# Patient Record
Sex: Male | Born: 1995 | Race: Black or African American | Hispanic: No | Marital: Single | Smoking: Never smoker
Health system: Southern US, Community
[De-identification: ages and names within clinical notes are randomized; demographics above are authoritative.]

## PROBLEM LIST (undated history)

## (undated) DIAGNOSIS — E559 Vitamin D deficiency, unspecified: Secondary | ICD-10-CM

## (undated) DIAGNOSIS — F32A Depression, unspecified: Secondary | ICD-10-CM

## (undated) DIAGNOSIS — N200 Calculus of kidney: Secondary | ICD-10-CM

---
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
## 2018-01-31 ENCOUNTER — Encounter (HOSPITAL_COMMUNITY): Payer: Self-pay | Admitting: Emergency Medicine

## 2018-01-31 ENCOUNTER — Ambulatory Visit (HOSPITAL_COMMUNITY)
Admission: EM | Admit: 2018-01-31 | Discharge: 2018-01-31 | Disposition: A | Discharge status: Home / Self Care | Payer: BLUE CROSS/BLUE SHIELD | Attending: Family Medicine | Admitting: Family Medicine

## 2018-01-31 DIAGNOSIS — R531 Weakness: Secondary | ICD-10-CM

## 2018-01-31 DIAGNOSIS — R42 Dizziness and giddiness: Secondary | ICD-10-CM

## 2018-01-31 DIAGNOSIS — R110 Nausea: Secondary | ICD-10-CM | POA: Diagnosis not present

## 2018-01-31 DIAGNOSIS — R062 Wheezing: Secondary | ICD-10-CM

## 2018-01-31 DIAGNOSIS — R5383 Other fatigue: Secondary | ICD-10-CM | POA: Diagnosis not present

## 2018-01-31 LAB — POCT I-STAT, CHEM 8
BUN: 10 mg/dL (ref 6–20)
Calcium, Ion: 1.12 mmol/L — ABNORMAL LOW (ref 1.15–1.40)
Chloride: 103 mmol/L (ref 98–111)
Creatinine, Ser: 0.9 mg/dL (ref 0.61–1.24)
Glucose, Bld: 86 mg/dL (ref 70–99)
HCT: 54 % — ABNORMAL HIGH (ref 39.0–52.0)
Hemoglobin: 18.4 g/dL — ABNORMAL HIGH (ref 13.0–17.0)
Potassium: 3.9 mmol/L (ref 3.5–5.1)
Sodium: 141 mmol/L (ref 135–145)
TCO2: 25 mmol/L (ref 22–32)

## 2018-01-31 MED ORDER — AEROCHAMBER PLUS FLO-VU MEDIUM MISC
1.0000 | Freq: Once | Status: AC
  Administered 2018-01-31: 1

## 2018-01-31 MED ORDER — ONDANSETRON 4 MG PO TBDP: 4.0000 mg | ORAL_TABLET | Freq: Three times a day (TID) | ORAL | 0 refills | Status: AC | PRN

## 2018-01-31 MED ORDER — ALBUTEROL SULFATE HFA 108 (90 BASE) MCG/ACT IN AERS
INHALATION_SPRAY | RESPIRATORY_TRACT | Status: AC
  Filled 2018-01-31: qty 6.7

## 2018-01-31 MED ORDER — IPRATROPIUM-ALBUTEROL 0.5-2.5 (3) MG/3ML IN SOLN
3.0000 mL | Freq: Once | RESPIRATORY_TRACT | Status: AC
  Administered 2018-01-31: 3 mL via RESPIRATORY_TRACT

## 2018-01-31 MED ORDER — ALBUTEROL SULFATE HFA 108 (90 BASE) MCG/ACT IN AERS
2.0000 | INHALATION_SPRAY | Freq: Once | RESPIRATORY_TRACT | Status: AC
  Administered 2018-01-31: 2 via RESPIRATORY_TRACT

## 2018-01-31 MED ORDER — AEROCHAMBER PLUS FLO-VU LARGE MISC
Status: AC
  Filled 2018-01-31: qty 1

## 2018-01-31 MED ORDER — IPRATROPIUM-ALBUTEROL 0.5-2.5 (3) MG/3ML IN SOLN
RESPIRATORY_TRACT | Status: AC
  Filled 2018-01-31: qty 3

## 2018-01-31 NOTE — ED Triage Notes (Signed)
Pt here for increased generalized weakness and fatigue with some dizziness

## 2018-01-31 NOTE — ED Provider Notes (Signed)
[facility]    CSN: [id number] Arrival date & time: 01/31/18  1559     History   Chief Complaint Chief Complaint  Patient presents with  . Fatigue    HPI Charles Kemp is a 22 y.o. male.   22 year old male with history of headaches comes in with mother for 2-week history of weakness, lightheadedness, intermittent nausea.  States has not had any aggravating or alleviating factor, and weakness and lightheadedness has been almost constant.  This can happen while he is laying down or when he is standing up.  Denies any increase in activity.  Nausea is intermittent without aggravating or alleviating factor.  Denies abdominal pain, vomiting, diarrhea.  Denies melena, hematochezia.  Has still been eating and drinking.  He denies fever, chills, night sweats.  Denies diaphoresis.  Denies chest pain, palpitations.  Does feel short of breath at times, where he feels as if he needs to work to take a deep breath.  Denies URI symptoms such as cough, congestion, sore throat.  Denies joint pain, though states it feels "tense".  Low back pain that started 3 weeks ago that is worse with movement.  Denies urinary symptoms such as frequency, dysuria, hematuria.  No known tick bites, but cut the grass about 1 to 2 weeks ago.     History reviewed. No pertinent past medical history.  There are no active problems to display for this patient.   History reviewed. No pertinent surgical history.     Home Medications    Prior to Admission medications   Medication Sig Start Date End Date Taking? Authorizing Provider  aspirin-acetaminophen-caffeine (EXCEDRIN MIGRAINE) [phone] MG per tablet Take by mouth every 6 (six) hours as needed for headache.    [provider]  DiphenhydrAMINE HCl (BENADRYL ALLERGY PO) Take by mouth.    [provider]  ipratropium (ATROVENT) 0.06 % nasal spray Place 2 sprays into both nostrils 4 (four) times daily. 04/05/14   Rodolph Bong, MD    ondansetron (ZOFRAN ODT) 4 MG disintegrating tablet Take 1 tablet (4 mg total) by mouth every 8 (eight) hours as needed for nausea or vomiting. 01/31/18   Belinda Fisher, PA-C    Family History History reviewed. No pertinent family history.  Social History Social History   Tobacco Use  . Smoking status: Never Smoker  Substance Use Topics  . Alcohol use: No  . Drug use: No     Allergies   Patient has no known allergies.   Review of Systems Review of Systems  Reason unable to perform ROS: See HPI as above.     Physical Exam Triage Vital Signs ED Triage Vitals [01/31/18 1621]  Enc Vitals Group     BP (!) 156/86     Pulse Rate 72     Resp 18     Temp 98.6 F (37 C)     Temp Source Oral     SpO2 97 %     Weight      Height      Head Circumference      Peak Flow      Pain Score      Pain Loc      Pain Edu?      Excl. in GC?    Orthostatic VS for the past 24 hrs:  BP- Lying Pulse- Lying BP- Sitting Pulse- Sitting BP- Standing at 0 minutes Pulse- Standing at 0 minutes  01/31/18 1711 135/87 73 (!) 143/96 82 (!)  139/96 81    Updated Vital Signs BP (!) 156/86 (BP Location: Left Arm)   Pulse 72   Temp 98.6 F (37 C) (Oral)   Resp 18   SpO2 97%   Physical Exam  Constitutional: He is oriented to person, place, and time. He appears well-developed and well-nourished. No distress.  HENT:  Head: Normocephalic and atraumatic.  Right Ear: Tympanic membrane, external ear and ear canal normal. Tympanic membrane is not erythematous and not bulging.  Left Ear: Tympanic membrane, external ear and ear canal normal. Tympanic membrane is not erythematous and not bulging.  Nose: Nose normal. Right sinus exhibits no maxillary sinus tenderness and no frontal sinus tenderness. Left sinus exhibits no maxillary sinus tenderness and no frontal sinus tenderness.  Mouth/Throat: Uvula is midline, oropharynx is clear and moist and mucous membranes are normal.  Eyes: Pupils are equal, round,  and reactive to light. Conjunctivae and EOM are normal.  Neck: Normal range of motion. Neck supple.  Cardiovascular: Normal rate, regular rhythm and normal heart sounds. Exam reveals no gallop and no friction rub.  No murmur heard. Pulmonary/Chest: Effort normal and breath sounds normal. No accessory muscle usage. No respiratory distress.  Inspiratory and expiratory wheezing of bilateral upper lobes.  No other adventitious lung sounds heard.  Lymphadenopathy:    He has no cervical adenopathy.  Neurological: He is alert and oriented to person, place, and time. He has normal strength. He is not disoriented. No cranial nerve deficit or sensory deficit. He displays a negative Romberg sign. Coordination and gait normal. GCS eye subscore is 4. GCS verbal subscore is 5. GCS motor subscore is 6.  Normal finger-to-nose, rapid movement.  No pronator drift.  Able to get on and off exam table without difficulty.  Skin: Skin is warm and dry.  Psychiatric: He has a normal mood and affect. His behavior is normal. Judgment normal.    UC Treatments / Results  Labs (all labs ordered are listed, but only abnormal results are displayed) Labs Reviewed  POCT I-STAT, CHEM 8 - Abnormal; Notable for the following components:      Result Value   Calcium, Ion 1.12 (*)    Hemoglobin 18.4 (*)    HCT 54.0 (*)    All other components within normal limits    EKG None  Radiology No results found.  Procedures Procedures (including critical care time)  Medications Ordered in UC Medications  ipratropium-albuterol (DUONEB) 0.5-2.5 (3) MG/3ML nebulizer solution 3 mL (3 mLs Nebulization Given 01/31/18 1728)  albuterol (PROVENTIL HFA;VENTOLIN HFA) 108 (90 Base) MCG/ACT inhaler 2 puff (2 puffs Inhalation Provided for home use 01/31/18 1812)  AEROCHAMBER PLUS FLO-VU MEDIUM MISC 1 each (1 each Other Provided for home use 01/31/18 1813)    Initial Impression / Assessment and Plan / UC Course  I have reviewed the triage  vital signs and the nursing notes.  Pertinent labs & imaging results that were available during my care of the patient were reviewed by me and considered in my medical decision making (see chart for details).    Patient with improvement after duoneb, though continues with mild wheezing on expiration of the upper lobe. EKG NSR, 79bpm. istat with hgb of 18.4. Neurology exam grossly intact without focal deficits. Although with concerning history, patient is stable and does not require immediate workup at the emergency department. Will provide albuterol inhaler for wheezing as needed with spacer. Given patient has been avoiding fluid and food intake due to nausea and fear of  vomiting, will provide zofran. Patient to push fluids. Patient with PCP appointment tomorrow, to follow up as scheduled for further management and evaluation. Return precautions given. Patient and mother expresses understanding and agrees to plan.   Final Clinical Impressions(s) / UC Diagnoses   Final diagnoses:  Fatigue, unspecified type  Weakness  Nausea without vomiting    ED Prescriptions    Medication Sig Dispense Auth. Provider   ondansetron (ZOFRAN ODT) 4 MG disintegrating tablet Take 1 tablet (4 mg total) by mouth every 8 (eight) hours as needed for nausea or vomiting. 10 tablet Threasa AlphaYu, Amy V, PA-C        Yu, Amy V, [place] 01/31/18 2150

## 2018-01-31 NOTE — Discharge Instructions (Signed)
No alarming signs on exam.  Your EKG was normal.  Your blood work showed increase in hemoglobin, this could be due to dehydration, but could also be other causes and will need fur there follow up.  Zofran for nausea. Keep hydrated, your urine should be clear to pale yellow in color. As discussed, some wheezing on exam. Will provide albuterol inhaler with chamber, you can use as needed every 4-6 hours for wheezing or shortness of breath. Follow up with PCP as scheduled for further evaluation and management needed. If experiencing worsening symptoms, chest pain, palpitations, shortness of breath not treated by albuterol, passing out, confusion/altered mental status, go to the emergency department for further evaluation needed.

## 2018-02-03 ENCOUNTER — Encounter (HOSPITAL_COMMUNITY): Payer: Self-pay | Admitting: Emergency Medicine

## 2018-02-03 ENCOUNTER — Emergency Department (HOSPITAL_COMMUNITY)
Admission: EM | Admit: 2018-02-03 | Discharge: 2018-02-03 | Disposition: A | Discharge status: Home / Self Care | Payer: BLUE CROSS/BLUE SHIELD | Attending: Emergency Medicine | Admitting: Emergency Medicine

## 2018-02-03 DIAGNOSIS — Z5321 Procedure and treatment not carried out due to patient leaving prior to being seen by health care provider: Secondary | ICD-10-CM | POA: Insufficient documentation

## 2018-02-03 DIAGNOSIS — R51 Headache: Secondary | ICD-10-CM | POA: Diagnosis present

## 2018-02-03 LAB — CBC
HCT: 45.1 % (ref 39.0–52.0)
Hemoglobin: 15.1 g/dL (ref 13.0–17.0)
MCH: 27.7 pg (ref 26.0–34.0)
MCHC: 33.5 g/dL (ref 30.0–36.0)
MCV: 82.6 fL (ref 78.0–100.0)
Platelets: 207 10*3/uL (ref 150–400)
RBC: 5.46 MIL/uL (ref 4.22–5.81)
RDW: 12.8 % (ref 11.5–15.5)
WBC: 4.3 10*3/uL (ref 4.0–10.5)

## 2018-02-03 LAB — COMPREHENSIVE METABOLIC PANEL
ALT: 18 U/L (ref 0–44)
AST: 18 U/L (ref 15–41)
Albumin: 4.3 g/dL (ref 3.5–5.0)
Alkaline Phosphatase: 59 U/L (ref 38–126)
Anion gap: 8 (ref 5–15)
BUN: 8 mg/dL (ref 6–20)
CO2: 27 mmol/L (ref 22–32)
Calcium: 9.6 mg/dL (ref 8.9–10.3)
Chloride: 105 mmol/L (ref 98–111)
Creatinine, Ser: 1.03 mg/dL (ref 0.61–1.24)
GFR calc Af Amer: >60 mL/min (ref >60)
GFR calc non Af Amer: >60 mL/min (ref >60)
Glucose, Bld: 103 mg/dL — ABNORMAL HIGH (ref 70–99)
Potassium: 4.2 mmol/L (ref 3.5–5.1)
Sodium: 140 mmol/L (ref 135–145)
Total Bilirubin: 1.7 mg/dL — ABNORMAL HIGH (ref 0.3–1.2)
Total Protein: 7.7 g/dL (ref 6.5–8.1)

## 2018-02-03 LAB — LIPASE, BLOOD: Lipase: 26 U/L (ref 11–51)

## 2018-02-03 NOTE — ED Notes (Signed)
Pt called for room, no responce 

## 2018-02-03 NOTE — ED Triage Notes (Signed)
Patient here from home with complaints of nausea, vomiting. Seen at Urgent Care on Tuesday given Zofran and seen by PCP Wednesday. Still reports same.

## 2018-02-03 NOTE — ED Notes (Signed)
Called 3x to collect UA sample. No response.

## 2018-02-03 NOTE — ED Notes (Signed)
Patient called and no answer. 

## 2018-02-05 ENCOUNTER — Emergency Department (HOSPITAL_COMMUNITY)
Admission: EM | Admit: 2018-02-05 | Discharge: 2018-02-05 | Disposition: A | Discharge status: Home / Self Care | Payer: BLUE CROSS/BLUE SHIELD | Attending: Emergency Medicine | Admitting: Emergency Medicine

## 2018-02-05 ENCOUNTER — Other Ambulatory Visit: Payer: Self-pay

## 2018-02-05 ENCOUNTER — Encounter (HOSPITAL_COMMUNITY): Payer: Self-pay | Admitting: Emergency Medicine

## 2018-02-05 DIAGNOSIS — Z79899 Other long term (current) drug therapy: Secondary | ICD-10-CM | POA: Insufficient documentation

## 2018-02-05 DIAGNOSIS — R55 Syncope and collapse: Secondary | ICD-10-CM | POA: Insufficient documentation

## 2018-02-05 DIAGNOSIS — E860 Dehydration: Secondary | ICD-10-CM | POA: Diagnosis not present

## 2018-02-05 DIAGNOSIS — R42 Dizziness and giddiness: Secondary | ICD-10-CM | POA: Diagnosis present

## 2018-02-05 DIAGNOSIS — Z7982 Long term (current) use of aspirin: Secondary | ICD-10-CM | POA: Diagnosis not present

## 2018-02-05 LAB — URINALYSIS, ROUTINE W REFLEX MICROSCOPIC
BILIRUBIN URINE: NEGATIVE
Glucose, UA: NEGATIVE mg/dL
Hgb urine dipstick: NEGATIVE
Ketones, ur: 20 mg/dL — AB
Leukocytes, UA: NEGATIVE
Nitrite: NEGATIVE
PH: 8 (ref 5.0–8.0)
Protein, ur: NEGATIVE mg/dL
SPECIFIC GRAVITY, URINE: 1.009 (ref 1.005–1.030)

## 2018-02-05 LAB — COMPREHENSIVE METABOLIC PANEL
ALBUMIN: 4.4 g/dL (ref 3.5–5.0)
ALT: 16 U/L (ref 0–44)
ANION GAP: 8 (ref 5–15)
AST: 18 U/L (ref 15–41)
Alkaline Phosphatase: 62 U/L (ref 38–126)
BILIRUBIN TOTAL: 1.8 mg/dL — AB (ref 0.3–1.2)
BUN: 7 mg/dL (ref 6–20)
CHLORIDE: 103 mmol/L (ref 98–111)
CO2: 31 mmol/L (ref 22–32)
Calcium: 10 mg/dL (ref 8.9–10.3)
Creatinine, Ser: 0.98 mg/dL (ref 0.61–1.24)
GFR calc Af Amer: >60 mL/min (ref >60)
Glucose, Bld: 97 mg/dL (ref 70–99)
POTASSIUM: 3.8 mmol/L (ref 3.5–5.1)
Sodium: 142 mmol/L (ref 135–145)
TOTAL PROTEIN: 8 g/dL (ref 6.5–8.1)

## 2018-02-05 LAB — CBC
HEMATOCRIT: 48.8 % (ref 39.0–52.0)
HEMOGLOBIN: 16.2 g/dL (ref 13.0–17.0)
MCH: 27.4 pg (ref 26.0–34.0)
MCHC: 33.2 g/dL (ref 30.0–36.0)
MCV: 82.4 fL (ref 78.0–100.0)
Platelets: 198 10*3/uL (ref 150–400)
RBC: 5.92 MIL/uL — ABNORMAL HIGH (ref 4.22–5.81)
RDW: 12.6 % (ref 11.5–15.5)
WBC: 4.4 10*3/uL (ref 4.0–10.5)

## 2018-02-05 LAB — LIPASE, BLOOD: LIPASE: 25 U/L (ref 11–51)

## 2018-02-05 LAB — CK: Total CK: 145 U/L (ref 49–397)

## 2018-02-05 LAB — I-STAT CG4 LACTIC ACID, ED: Lactic Acid, Venous: 1.25 mmol/L (ref 0.5–1.9)

## 2018-02-05 MED ORDER — PROMETHAZINE HCL 25 MG/ML IJ SOLN
25.0000 mg | Freq: Once | INTRAMUSCULAR | Status: AC
  Administered 2018-02-05: 25 mg via INTRAVENOUS
  Filled 2018-02-05: qty 1

## 2018-02-05 MED ORDER — PROMETHAZINE HCL 25 MG PO TABS: 25.0000 mg | ORAL_TABLET | Freq: Four times a day (QID) | ORAL | 0 refills | Status: AC | PRN

## 2018-02-05 MED ORDER — SODIUM CHLORIDE 0.9 % IV BOLUS (SEPSIS)
1000.0000 mL | Freq: Once | INTRAVENOUS | Status: AC
  Administered 2018-02-05: 1000 mL via INTRAVENOUS

## 2018-02-05 MED ORDER — SODIUM CHLORIDE 0.9 % IV BOLUS
2000.0000 mL | Freq: Once | INTRAVENOUS | Status: AC
  Administered 2018-02-05: 2000 mL via INTRAVENOUS

## 2018-02-05 NOTE — Discharge Instructions (Addendum)
Follow-up with your primary doctor.  Slowly increase your fluid intake.  Return for any worsening in your condition.

## 2018-02-05 NOTE — ED Notes (Signed)
Pt given an urinal and made aware of need for urine specimen. 

## 2018-02-05 NOTE — ED Provider Notes (Signed)
[facility]-EMERGENCY DEPT Provider Note   CSN: [id number] Arrival date & time: 02/05/18  1332     History   Chief Complaint Chief Complaint  Patient presents with  . Dizziness  . Emesis    HPI Charles Kemp is a 22 y.o. male.  HPI Patient presents to the emergency department with dizziness and vomiting.  The patient states his been having some issues over the last couple weeks.  With dizziness but then started vomiting Tuesday.  Patient states that nothing seems to make the condition better or worse.  Patient states he feels mostly dizzy and first thing in the morning and in a hot shower.  The patient denies chest pain, shortness of breath, headache,blurred vision, neck pain, fever, cough, weakness, numbness, anorexia, edema, abdominal pain, diarrhea, rash, back pain, dysuria, hematemesis, bloody stool, near syncope, or syncope.  Patient was seen by the urgent care at [facility] along with his primary doctor the next day.  The patient states that he was noted to have low vitamin D levels. History reviewed. No pertinent past medical history.  There are no active problems to display for this patient.   History reviewed. No pertinent surgical history.      Home Medications    Prior to Admission medications   Medication Sig Start Date End Date Taking? Authorizing Provider  Albuterol Sulfate (PROAIR RESPICLICK) 108 (90 Base) MCG/ACT AEPB Inhale 2 puffs into the lungs 2 (two) times daily as needed for shortness of breath.   Yes [provider]  aspirin-acetaminophen-caffeine (EXCEDRIN MIGRAINE) [phone] MG per tablet Take by mouth every 6 (six) hours as needed for headache.   Yes [provider]  Cholecalciferol (VITAMIN D3) 50000 units TABS Take 50,000 Units by mouth once a week. 02/03/18 04/22/18 Yes [provider]  DiphenhydrAMINE HCl (BENADRYL ALLERGY PO) Take 1 tablet by mouth daily as needed (seasonal allergies).    Yes [provider]  ipratropium (ATROVENT) 0.06 % nasal spray Place 2 sprays into both nostrils 4 (four) times daily. 04/05/14  Yes Rodolph Bong, MD  ondansetron (ZOFRAN ODT) 4 MG disintegrating tablet Take 1 tablet (4 mg total) by mouth every 8 (eight) hours as needed for nausea or vomiting. 01/31/18  Yes Belinda Fisher, PA-C    Family History No family history on file.  Social History Social History   Tobacco Use  . Smoking status: Never Smoker  . Smokeless tobacco: Never Used  Substance Use Topics  . Alcohol use: No  . Drug use: No     Allergies   Patient has no known allergies.   Review of Systems Review of Systems All other systems negative except as documented in the HPI. All pertinent positives and negatives as reviewed in the HPI.  Physical Exam Updated Vital Signs BP (!) 139/98 (BP Location: Right Arm)   Pulse 63   Temp 98.2 F (36.8 C) (Oral)   Resp 15   SpO2 100%   Physical Exam  Constitutional: He is oriented to person, place, and time. He appears well-developed and well-nourished. No distress.  HENT:  Head: Normocephalic and atraumatic.  Mouth/Throat: Oropharynx is clear and moist.  Eyes: Pupils are equal, round, and reactive to light.  Neck: Normal range of motion. Neck supple.  Cardiovascular: Normal rate, regular rhythm and normal heart sounds. Exam reveals no gallop and no friction rub.  No murmur heard. Pulmonary/Chest: Effort normal and breath sounds normal. No respiratory distress. He has no wheezes.  Abdominal: Soft. Bowel sounds are normal. He exhibits no distension and no mass. There is no tenderness. There is no rebound and no guarding.  Neurological: He is alert and oriented to person, place, and time. No sensory deficit. He exhibits normal muscle tone. Coordination normal.  Skin: Skin is warm and dry. Capillary refill takes less than 2 seconds. No rash noted. No erythema.  Psychiatric: He has a normal mood and affect. His behavior is normal.  Nursing  note and vitals reviewed.    ED Treatments / Results  Labs (all labs ordered are listed, but only abnormal results are displayed) Labs Reviewed  COMPREHENSIVE METABOLIC PANEL - Abnormal; Notable for the following components:      Result Value   Total Bilirubin 1.8 (*)    All other components within normal limits  CBC - Abnormal; Notable for the following components:   RBC 5.92 (*)    All other components within normal limits  URINALYSIS, ROUTINE W REFLEX MICROSCOPIC - Abnormal; Notable for the following components:   Ketones, ur 20 (*)    All other components within normal limits  LIPASE, BLOOD  CK  I-STAT CG4 LACTIC ACID, ED  I-STAT CG4 LACTIC ACID, ED    EKG None  Radiology No results found.  Procedures Procedures (including critical care time)  Medications Ordered in ED Medications  sodium chloride 0.9 % bolus 2,000 mL (0 mLs Intravenous Stopped 02/05/18 1845)  promethazine (PHENERGAN) injection 25 mg (25 mg Intravenous Given 02/05/18 1621)  sodium chloride 0.9 % bolus 1,000 mL (1,000 mLs Intravenous New Bag/Given 02/05/18 1851)     Initial Impression / Assessment and Plan / ED Course  I have reviewed the triage vital signs and the nursing notes.  Pertinent labs & imaging results that were available during my care of the patient were reviewed by me and considered in my medical decision making (see chart for details).     Based off the recent laboratory testing along with this testing here today the patient does appear to have some level of dehydration.  He was given 3 L of IV fluids along with anti-medic.  The patient is reporting no problems at this time.  His vital signs have been stable here in the emergency department.  His blood pressures are slightly elevated I did advise the family to follow-up with his doctor on these.  Patient is advised to return here as needed.  Told to increase his fluid intake and rest as much as possible.  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  Final Clinical Impressions(s) / ED Diagnoses   Final diagnoses:  None    ED Discharge Orders    None       Charlestine NightLawyer, Zniyah Midkiff, PA-C 02/05/18 1931    Long, Arlyss RepressJoshua G, MD 02/06/18 1130

## 2018-02-05 NOTE — ED Notes (Signed)
He remains in no distress, and his mother remains with him.

## 2018-02-05 NOTE — ED Triage Notes (Signed)
Pt c/o dizziness since 3 weeks. Reports vomiting started Tuesday. Went to UC and was told all labs looked good. Reports followed up with PCP the next day and was told Vit D low-prescribed that. Pt also seen here but left before was seen due to long waits.  Yesterday was first day taking Vit D 3. Zofran not working.

## 2018-02-08 ENCOUNTER — Ambulatory Visit: Payer: BLUE CROSS/BLUE SHIELD | Admitting: Gastroenterology

## 2021-10-22 ENCOUNTER — Ambulatory Visit
Admission: EM | Admit: 2021-10-22 | Discharge: 2021-10-22 | Disposition: A | Discharge status: Home / Self Care | Payer: BC Managed Care – PPO

## 2021-10-22 ENCOUNTER — Other Ambulatory Visit: Payer: Self-pay

## 2021-10-22 ENCOUNTER — Encounter: Payer: Self-pay | Admitting: Emergency Medicine

## 2021-10-22 DIAGNOSIS — R599 Enlarged lymph nodes, unspecified: Secondary | ICD-10-CM

## 2021-10-22 DIAGNOSIS — R030 Elevated blood-pressure reading, without diagnosis of hypertension: Secondary | ICD-10-CM

## 2021-10-22 NOTE — Discharge Instructions (Addendum)
Some people have lymph nodes that you can feel through their skin. Yours seem normal and healthy to me today.  ? ?Keep an eye on your blood pressure - it's a little high today.  Blood pressure can be high because of pain, stress, caffeine, cigarette smoking but can also be high due to hypertension a disease of the blood pressure where it is elevated all the time.  ?

## 2021-10-22 NOTE — ED Provider Notes (Signed)
?[facility] ? ? ? ?CSN: [id number] ?Arrival date & time: 10/22/21  1102 ? ? ?  ? ?History   ?Chief Complaint ?Chief Complaint  ?Patient presents with  ? Bump Groin Area  ? ? ?HPI ?Charles Kemp is a 26 y.o. male.  Patient noticed a bump in his right groin area about a week ago.  Is not painful unless he pushes on it.  Denies urinary symptoms.  Denies rash, fever, chills, penile discharge, any genital lesions. ? ?HPI ? ?History reviewed. No pertinent past medical history. ? ?There are no problems to display for this patient. ? ? ?History reviewed. No pertinent surgical history. ? ? ? ? ?Home Medications   ? ?Prior to Admission medications   ?Medication Sig Start Date End Date Taking? Authorizing Provider  ?Albuterol Sulfate (PROAIR RESPICLICK) 108 (90 Base) MCG/ACT AEPB Inhale 2 puffs into the lungs 2 (two) times daily as needed for shortness of breath.    [provider]  ?aspirin-acetaminophen-caffeine (EXCEDRIN MIGRAINE) [phone] MG per tablet Take by mouth every 6 (six) hours as needed for headache.    [provider]  ?DiphenhydrAMINE HCl (BENADRYL ALLERGY PO) Take 1 tablet by mouth daily as needed (seasonal allergies).     [provider]  ?ipratropium (ATROVENT) 0.06 % nasal spray Place 2 sprays into both nostrils 4 (four) times daily. 04/05/14   Rodolph Bong, MD  ?ondansetron (ZOFRAN ODT) 4 MG disintegrating tablet Take 1 tablet (4 mg total) by mouth every 8 (eight) hours as needed for nausea or vomiting. 01/31/18   Cathie Hoops, Amy V, PA-C  ?promethazine (PHENERGAN) 25 MG tablet Take 1 tablet (25 mg total) by mouth every 6 (six) hours as needed for nausea or vomiting. 02/05/18   Charlestine Night, PA-C  ? ? ?Family History ?History reviewed. No pertinent family history. ? ?Social History ?Social History  ? ?Tobacco Use  ? Smoking status: Never  ? Smokeless tobacco: Never  ?Vaping Use  ? Vaping Use: Every day  ?Substance Use Topics  ? Alcohol use: No  ? Drug use: No   ? ? ? ?Allergies   ?Patient has no known allergies. ? ? ?Review of Systems ?Review of Systems  ?Constitutional:  Negative for chills and fever.  ?Genitourinary:  Negative for dysuria, genital sores and penile discharge.  ?Skin:  Negative for rash and wound.  ? ? ?Physical Exam ?Triage Vital Signs ?ED Triage Vitals  ?Enc Vitals Group  ?   BP 10/22/21 1115 (!) 144/100  ?   Pulse Rate 10/22/21 1115 87  ?   Resp 10/22/21 1115 18  ?   Temp 10/22/21 1115 98.2 ?F (36.8 ?C)  ?   Temp Source 10/22/21 1115 Oral  ?   SpO2 10/22/21 1115 98 %  ?   Weight --   ?   Height --   ?   Head Circumference --   ?   Peak Flow --   ?   Pain Score 10/22/21 1120 0  ?   Pain Loc --   ?   Pain Edu? --   ?   Excl. in GC? --   ? ?No data found. ? ?Updated Vital Signs ?BP (!) 145/94 (BP Location: Left Arm)   Pulse 87   Temp 98.2 ?F (36.8 ?C) (Oral)   Resp 18   SpO2 98%  ? ?Visual Acuity ?Right Eye Distance:   ?Left Eye Distance:   ?Bilateral Distance:   ? ?Right Eye Near:   ?Left  Eye Near:    ?Bilateral Near:    ? ?Physical Exam ?Constitutional:   ?   Appearance: Normal appearance. He is not ill-appearing.  ?Pulmonary:  ?   Effort: Pulmonary effort is normal.  ?Genitourinary: ?   Pubic Area: No rash.   ?Lymphadenopathy:  ?   Comments: Palpable lymph nodes x 2 in R groin. No lymphadenopathy  ?Skin: ?   General: Skin is warm and dry.  ?Neurological:  ?   Mental Status: He is alert.  ? ? ? ?UC Treatments / Results  ?Labs ?(all labs ordered are listed, but only abnormal results are displayed) ?Labs Reviewed - No data to display ? ?EKG ? ? ?Radiology ?No results found. ? ?Procedures ?Procedures (including critical care time) ? ?Medications Ordered in UC ?Medications - No data to display ? ?Initial Impression / Assessment and Plan / UC Course  ?I have reviewed the triage vital signs and the nursing notes. ? ?Pertinent labs & imaging results that were available during my care of the patient were reviewed by me and considered in my medical decision  making (see chart for details). ? ?  ?Reassured patient palpable lymph nodes and thin, healthy people are not unusual.  BP recheck shows BP still elevated today; instructed patient to pay attention to BP and have it checked again in the future. ? ?Final Clinical Impressions(s) / UC Diagnoses  ? ?Final diagnoses:  ?Palpable lymph node  ?Elevated BP without diagnosis of hypertension  ? ? ? ?Discharge Instructions   ? ?  ?Some people have lymph nodes that you can feel through their skin. Yours seem normal and healthy to me today.  ? ?Keep an eye on your blood pressure - it's a little high today.  Blood pressure can be high because of pain, stress, caffeine, cigarette smoking but can also be high due to hypertension a disease of the blood pressure where it is elevated all the time.  ? ? ?ED Prescriptions   ?None ?  ? ?PDMP not reviewed this encounter. ?  ?Cathlyn Parsons, NP ?10/22/21 1151 ? ?

## 2021-10-22 NOTE — ED Triage Notes (Signed)
Pt presents with a bump on his groin area x 1 week. Pt states pain only occurs when pressure is applied to the area. Pt denies any urinary symptoms.  ?

## 2021-10-29 ENCOUNTER — Other Ambulatory Visit: Payer: Self-pay

## 2021-10-29 ENCOUNTER — Encounter: Payer: Self-pay | Admitting: Emergency Medicine

## 2021-10-29 ENCOUNTER — Ambulatory Visit
Admission: EM | Admit: 2021-10-29 | Discharge: 2021-10-29 | Disposition: A | Discharge status: Other Acute Inpatient Hospital | Payer: BC Managed Care – PPO | Attending: Internal Medicine | Admitting: Internal Medicine

## 2021-10-29 DIAGNOSIS — K921 Melena: Secondary | ICD-10-CM | POA: Insufficient documentation

## 2021-10-29 DIAGNOSIS — R369 Urethral discharge, unspecified: Secondary | ICD-10-CM

## 2021-10-29 DIAGNOSIS — Z113 Encounter for screening for infections with a predominantly sexual mode of transmission: Secondary | ICD-10-CM | POA: Diagnosis present

## 2021-10-29 LAB — POCT URINALYSIS DIP (MANUAL ENTRY)
Bilirubin, UA: NEGATIVE
Glucose, UA: NEGATIVE mg/dL
Nitrite, UA: NEGATIVE
Protein Ur, POC: NEGATIVE mg/dL
Spec Grav, UA: 1.02 (ref 1.010–1.025)
Urobilinogen, UA: 0.2 E.U./dL
pH, UA: 6.5 (ref 5.0–8.0)

## 2021-10-29 MED ORDER — CEFTRIAXONE SODIUM 1 G IJ SOLR
1.0000 g | Freq: Once | INTRAMUSCULAR | Status: AC
  Administered 2021-10-29: 1 g via INTRAMUSCULAR

## 2021-10-29 NOTE — ED Triage Notes (Signed)
Patient c/o blood in urine this morning, denies any pain.  Patient did have penile discharge about a week ago.  Would like STIs.  No dysuria, some frequency, no urgency. ?

## 2021-10-29 NOTE — Discharge Instructions (Signed)
You have been treated today for possible STD as well as possible urinary tract infection.  Your STD swab is pending.  Your urine culture is also pending.  We will call if there are any abnormalities.  Please refrain from sexual activity until test results and treatment are complete.  Please go to the ER as soon as you leave urgent care for further evaluation and management of blood in your stool. ?

## 2021-10-29 NOTE — ED Provider Notes (Addendum)
?[facility] ? ? ? ?CSN: [id number] ?Arrival date & time: 10/29/21  1348 ? ? ?  ? ?History   ?Chief Complaint ?Chief Complaint  ?Patient presents with  ? Hematuria  ? ? ?HPI ?Charles Kemp is a 26 y.o. male.  ? ?Patient originally reported to nurse in triage that he was having some blood in his urine as well as penile discharge.  He reported to healthcare provider that he was not having blood in his urine, but he was having blood in his stool.  Patient reports that he had one episode of copious amount of blood in the toilet that came from his rectum earlier today.  Denies any associated rectal pain, diarrhea, constipation, nausea, vomiting.  Patient reports has been having normal bowel movements.  Denies any suspicion of hemorrhoids.  Patient does report that he has been having some penile discharge that is yellow in color but denies any dysuria, urinary frequency, urinary burning, testicular pain.  He was seen at a different urgent care on 10/22/2021 for right groin lymph node swelling but no STD testing was completed at that time.  He was sent home with watchful waiting.  Denies any known exposure to STD but has had unprotected sexual intercourse recently. ? ? ?Hematuria ? ? ?History reviewed. No pertinent past medical history. ? ?There are no problems to display for this patient. ? ? ?History reviewed. No pertinent surgical history. ? ? ? ? ?Home Medications   ? ?Prior to Admission medications   ?Medication Sig Start Date End Date Taking? Authorizing Provider  ?Albuterol Sulfate (PROAIR RESPICLICK) 108 (90 Base) MCG/ACT AEPB Inhale 2 puffs into the lungs 2 (two) times daily as needed for shortness of breath.    [provider]  ?aspirin-acetaminophen-caffeine (EXCEDRIN MIGRAINE) [phone] MG per tablet Take by mouth every 6 (six) hours as needed for headache.    [provider]  ?DiphenhydrAMINE HCl (BENADRYL ALLERGY PO) Take 1 tablet by mouth daily as needed (seasonal allergies).      [provider]  ?ipratropium (ATROVENT) 0.06 % nasal spray Place 2 sprays into both nostrils 4 (four) times daily. 04/05/14   Rodolph Bong, MD  ?ondansetron (ZOFRAN ODT) 4 MG disintegrating tablet Take 1 tablet (4 mg total) by mouth every 8 (eight) hours as needed for nausea or vomiting. 01/31/18   Cathie Hoops, Amy V, PA-C  ?promethazine (PHENERGAN) 25 MG tablet Take 1 tablet (25 mg total) by mouth every 6 (six) hours as needed for nausea or vomiting. 02/05/18   Charlestine Night, PA-C  ? ? ?Family History ?History reviewed. No pertinent family history. ? ?Social History ?Social History  ? ?Tobacco Use  ? Smoking status: Never  ? Smokeless tobacco: Never  ?Vaping Use  ? Vaping Use: Every day  ?Substance Use Topics  ? Alcohol use: No  ? Drug use: No  ? ? ? ?Allergies   ?Patient has no known allergies. ? ? ?Review of Systems ?Review of Systems ?Per HPI ? ?Physical Exam ?Triage Vital Signs ?ED Triage Vitals  ?Enc Vitals Group  ?   BP 10/29/21 1404 (!) 147/94  ?   Pulse Rate 10/29/21 1404 78  ?   Resp 10/29/21 1404 18  ?   Temp 10/29/21 1404 98.2 ?F (36.8 ?C)  ?   Temp Source 10/29/21 1404 Oral  ?   SpO2 10/29/21 1404 98 %  ?   Weight 10/29/21 1405 148 lb (67.1 kg)  ?   Height 10/29/21 1405 6\' 1"  (  1.854 m)  ?   Head Circumference --   ?   Peak Flow --   ?   Pain Score 10/29/21 1405 0  ?   Pain Loc --   ?   Pain Edu? --   ?   Excl. in GC? --   ? ?No data found. ? ?Updated Vital Signs ?BP (!) 147/94 (BP Location: Left Arm)   Pulse 78   Temp 98.2 ?F (36.8 ?C) (Oral)   Resp 18   Ht 6\' 1"  (1.854 m)   Wt 148 lb (67.1 kg)   SpO2 98%   BMI 19.53 kg/m?  ? ?Visual Acuity ?Right Eye Distance:   ?Left Eye Distance:   ?Bilateral Distance:   ? ?Right Eye Near:   ?Left Eye Near:    ?Bilateral Near:    ? ?Physical Exam ?Constitutional:   ?   General: He is not in acute distress. ?   Appearance: Normal appearance. He is not toxic-appearing or diaphoretic.  ?HENT:  ?   Head: Normocephalic and atraumatic.  ?Eyes:  ?   Extraocular  Movements: Extraocular movements intact.  ?   Conjunctiva/sclera: Conjunctivae normal.  ?Cardiovascular:  ?   Rate and Rhythm: Normal rate and regular rhythm.  ?   Pulses: Normal pulses.  ?   Heart sounds: Normal heart sounds.  ?Pulmonary:  ?   Effort: Pulmonary effort is normal. No respiratory distress.  ?   Breath sounds: Normal breath sounds.  ?Genitourinary: ?   Comments: Deferred with shared decision making.  Self swab performed. ?Neurological:  ?   General: No focal deficit present.  ?   Mental Status: He is alert and oriented to person, place, and time. Mental status is at baseline.  ?Psychiatric:     ?   Mood and Affect: Mood normal.     ?   Behavior: Behavior normal.     ?   Thought Content: Thought content normal.     ?   Judgment: Judgment normal.  ? ? ? ?UC Treatments / Results  ?Labs ?(all labs ordered are listed, but only abnormal results are displayed) ?Labs Reviewed  ?POCT URINALYSIS DIP (MANUAL ENTRY) - Abnormal; Notable for the following components:  ?    Result Value  ? Ketones, POC UA large (80) (*)   ? Blood, UA trace-intact (*)   ? Leukocytes, UA Small (1+) (*)   ? All other components within normal limits  ?URINE CULTURE  ?CYTOLOGY, (ORAL, ANAL, URETHRAL) ANCILLARY ONLY  ? ? ?EKG ? ? ?Radiology ?No results found. ? ?Procedures ?Procedures (including critical care time) ? ?Medications Ordered in UC ?Medications  ?cefTRIAXone (ROCEPHIN) injection 1 g (has no administration in time range)  ? ? ?Initial Impression / Assessment and Plan / UC Course  ?I have reviewed the triage vital signs and the nursing notes. ? ?Pertinent labs & imaging results that were available during my care of the patient were reviewed by me and considered in my medical decision making (see chart for details). ? ?  ? ?Due to patient reporting that he had hematuria to nurse during triage, urinalysis and cytology swab were completed prior to being seen by healthcare provider.  It did show ketones, blood, and small amount of  leukocytes.  Cytology swab was completed due to patient reporting penile discharge and patient request for STD penile swab.  Patient had picture of amount of blood that was in toilet from earlier today, and it is a copious amount of bright  red blood.  Therefore, patient was advised that he will need to go to the hospital for further evaluation and management of this as he requires a more extensive evaluation than can be provided at the urgent care.  Patient was agreeable with plan.  I do think it is reasonable to administer 1000 mg Rocephin IM here in urgent care to cover for possible UTI with small leuks on urinalysis as well as possible gonorrhea with penile discharge.  Urine culture and cytology swab are pending.  Will wait to add or change treatment if necessary once these results are back.  Patient was agreeable with plan and left to go to the hospital to have blood in stool evaluated once injection was given.  I do think that the large amount of ketones in the urine needs to be evaluated at the ED as well. Vital signs are stable so patient left via self transport. ?Final Clinical Impressions(s) / UC Diagnoses  ? ?Final diagnoses:  ?Penile discharge  ?Screening examination for venereal disease  ?Blood in stool  ? ? ? ?Discharge Instructions   ? ?  ?You have been treated today for possible STD as well as possible urinary tract infection.  Your STD swab is pending.  Your urine culture is also pending.  We will call if there are any abnormalities.  Please refrain from sexual activity until test results and treatment are complete.  Please go to the ER as soon as you leave urgent care for further evaluation and management of blood in your stool. ? ? ? ?ED Prescriptions   ?None ?  ? ?PDMP not reviewed this encounter. ?  ?Gustavus Bryant, [place] ?10/29/21 1513 ? ?  ?Gustavus Bryant, [place] ?10/29/21 1514 ? ?

## 2021-10-30 ENCOUNTER — Telehealth (HOSPITAL_COMMUNITY): Payer: Self-pay | Admitting: Emergency Medicine

## 2021-10-30 LAB — URINE CULTURE: Culture: NO GROWTH

## 2021-10-30 LAB — CYTOLOGY, (ORAL, ANAL, URETHRAL) ANCILLARY ONLY
Chlamydia: POSITIVE — AB
Comment: NEGATIVE
Comment: NEGATIVE
Comment: NORMAL
Neisseria Gonorrhea: NEGATIVE
Trichomonas: NEGATIVE

## 2021-10-30 MED ORDER — DOXYCYCLINE HYCLATE 100 MG PO CAPS: 100.0000 mg | ORAL_CAPSULE | Freq: Two times a day (BID) | ORAL | 0 refills | Status: AC

## 2022-08-01 ENCOUNTER — Telehealth: Payer: BC Managed Care – PPO | Admitting: Family

## 2022-08-01 DIAGNOSIS — R109 Unspecified abdominal pain: Secondary | ICD-10-CM

## 2022-08-01 DIAGNOSIS — R319 Hematuria, unspecified: Secondary | ICD-10-CM | POA: Diagnosis not present

## 2022-08-01 MED ORDER — TAMSULOSIN HCL 0.4 MG PO CAPS: 0.4000 mg | ORAL_CAPSULE | Freq: Every day | ORAL | 0 refills | Status: AC

## 2022-08-01 NOTE — Progress Notes (Signed)
Virtual Visit Consent   Charles Kemp, you are scheduled for a virtual visit with a [facility] provider today. Just as with appointments in the office, your consent must be obtained to participate. Your consent will be active for this visit and any virtual visit you may have with one of our providers in the next 365 days. If you have a MyChart account, a copy of this consent can be sent to you electronically.  As this is a virtual visit, video technology does not allow for your provider to perform a traditional examination. This may limit your provider's ability to fully assess your condition. If your provider identifies any concerns that need to be evaluated in person or the need to arrange testing (such as labs, EKG, etc.), we will make arrangements to do so. Although advances in technology are sophisticated, we cannot ensure that it will always work on either your end or our end. If the connection with a video visit is poor, the visit may have to be switched to a telephone visit. With either a video or telephone visit, we are not always able to ensure that we have a secure connection.  By engaging in this virtual visit, you consent to the provision of healthcare and authorize for your insurance to be billed (if applicable) for the services provided during this visit. Depending on your insurance coverage, you may receive a charge related to this service.  I need to obtain your verbal consent now. Are you willing to proceed with your visit today? Charles Kemp has provided verbal consent on 08/01/2022 for a virtual visit (video or telephone). Evelina Dun, FNP  Date: 08/01/2022 5:16 PM  Virtual Visit via Video Note   I, Evelina Dun, connected with  Charles Kemp  ([id number], 1996/01/03) on 08/01/22 at  5:15 PM EST by a video-enabled telemedicine application and verified that I am speaking with the correct person using two identifiers.  Location: Patient: Virtual Visit Location Patient:  Home Provider: Virtual Visit Location Provider: Home Office   I discussed the limitations of evaluation and management by telemedicine and the availability of in person appointments. The patient expressed understanding and agreed to proceed.    History of Present Illness: Charles Kemp is a 26 y.o. who identifies as a male who was assigned male at birth, and is being seen today for flank pain that started two weeks ago. He was seen at the Urgent Care and was given antibiotics for UTI.   HPI: Flank Pain This is a new problem. The current episode started 1 to 4 weeks ago. The problem occurs intermittently. The problem has been waxing and waning since onset. The pain is at a severity of 10/10. The pain is moderate. Pertinent negatives include no bladder incontinence, dysuria or fever. Heath Lark )    Problems: There are no problems to display for this patient.   Allergies: No Known Allergies Medications:  Current Outpatient Medications:    tamsulosin (FLOMAX) 0.4 MG CAPS capsule, Take 1 capsule (0.4 mg total) by mouth daily., Disp: 30 capsule, Rfl: 0   Albuterol Sulfate (PROAIR RESPICLICK) 123XX123 (90 Base) MCG/ACT AEPB, Inhale 2 puffs into the lungs 2 (two) times daily as needed for shortness of breath., Disp: , Rfl:    aspirin-acetaminophen-caffeine (EXCEDRIN MIGRAINE) 250-250-65 MG per tablet, Take by mouth every 6 (six) hours as needed for headache., Disp: , Rfl:    DiphenhydrAMINE HCl (BENADRYL ALLERGY PO), Take 1 tablet by mouth daily as needed (seasonal allergies). ,  Disp: , Rfl:    ipratropium (ATROVENT) 0.06 % nasal spray, Place 2 sprays into both nostrils 4 (four) times daily., Disp: 15 mL, Rfl: 1   ondansetron (ZOFRAN ODT) 4 MG disintegrating tablet, Take 1 tablet (4 mg total) by mouth every 8 (eight) hours as needed for nausea or vomiting., Disp: 10 tablet, Rfl: 0   promethazine (PHENERGAN) 25 MG tablet, Take 1 tablet (25 mg total) by mouth every 6 (six) hours as needed for  nausea or vomiting., Disp: 10 tablet, Rfl: 0  Observations/Objective: Patient is well-developed, well-nourished in no acute distress.  Resting comfortably  at home.  Head is normocephalic, atraumatic.  No labored breathing.  Speech is clear and coherent with logical content.  Patient is alert and oriented at baseline.    Assessment and Plan: 1. Flank pain - tamsulosin (FLOMAX) 0.4 MG CAPS capsule; Take 1 capsule (0.4 mg total) by mouth daily.  Dispense: 30 capsule; Refill: 0  2. Hematuria, unspecified type - tamsulosin (FLOMAX) 0.4 MG CAPS capsule; Take 1 capsule (0.4 mg total) by mouth daily.  Dispense: 30 capsule; Refill: 0  Force fluids Call and make follow up with PCP this week. IF does not not pass stone will need CT scan. He is able to urinate at this time, no fever or current pain   Follow Up Instructions: I discussed the assessment and treatment plan with the patient. The patient was provided an opportunity to ask questions and all were answered. The patient agreed with the plan and demonstrated an understanding of the instructions.  A copy of instructions were sent to the patient via MyChart unless otherwise noted below.    The patient was advised to call back or seek an in-person evaluation if the symptoms worsen or if the condition fails to improve as anticipated.  Time:  I spent 12 minutes with the patient via telehealth technology discussing the above problems/concerns.    Jannifer Rodney, FNP

## 2022-08-01 NOTE — Patient Instructions (Signed)
Kidney Stones  Kidney stones are solid, rock-like deposits that form inside of the kidneys. The kidneys are a pair of organs that make urine. A kidney stone may form in a kidney and move into other parts of the urinary tract, including the tubes that connect the kidneys to the bladder (ureters), the bladder, and the tube that carries urine out of the body (urethra). As the stone moves through these areas, it can cause intense pain and block the flow of urine. Kidney stones are created when high levels of certain minerals are found in the urine. The stones are usually passed out of the body through urination, but in some cases, medical treatment may be needed to remove them. What are the causes? Kidney stones may be caused by: A condition in which certain glands produce too much parathyroid hormone (primary hyperparathyroidism), which causes too much calcium buildup in the blood. A buildup of uric acid crystals in the bladder (hyperuricosuria). Uric acid is a chemical that the body produces when you eat certain foods. It usually leaves the body in the urine. Narrowing (stricture) of one or both of the ureters. A kidney blockage that is present at birth (congenital obstruction). Past surgery on the kidney or the ureters. What increases the risk? The following factors may make you more likely to develop this condition: Having had a kidney stone in the past. Having a family history of kidney stones. Not drinking enough water. Eating a diet that is high in protein, salt (sodium), or sugar. Being overweight or obese. What are the signs or symptoms? Symptoms of a kidney stone may include: Pain in the side of the abdomen, right below the ribs (flank pain). Pain usually spreads (radiates) to the groin. Needing to urinate often or urgently. Painful urination. Blood in the urine (hematuria). Nausea. Vomiting. Fever and chills. How is this diagnosed? This condition may be diagnosed based on: Your  symptoms and medical history. A physical exam. Blood tests. Urine tests. These may be done before and after the stone passes out of your body through urination. Imaging tests, such as a CT scan, abdominal X-ray, or ultrasound. A procedure to examine the inside of the bladder (cystoscopy). How is this treated? Treatment for kidney stones depends on the size, location, and makeup of the stones. Kidney stones will often pass out of the body through urination. You may need to: Increase your fluid intake to help pass the stone. In some cases, you may be given fluids through an IV and may need to be monitored in the hospital. Take medicine for pain. Make changes in your diet to help prevent kidney stones from coming back. Sometimes, procedures are needed to remove a kidney stone. This may involve: A procedure to break up kidney stones using: A focused beam of light (laser therapy). Shock waves (extracorporeal shock wave lithotripsy). Surgery to remove kidney stones. This may be needed if you have severe pain or have stones that block your urinary tract. Follow these instructions at home: Medicines Take over-the-counter and prescription medicines only as told by your health care provider. Ask your health care provider if the medicine prescribed to you requires you to avoid driving or using heavy machinery. Eating and drinking Drink enough fluid to keep your urine pale yellow. You may be instructed to drink at least 8-10 glasses of water each day. This will help you pass the kidney stone. If directed, change your diet. This may include: Limiting how much sodium you eat. Eating more fruits   and vegetables. Limiting how much animal protein you eat. Animal proteins include red meat, poultry, fish, and eggs. Eating a normal amount of calcium (1,000-1,300 mg per day). Follow instructions from your health care provider about eating or drinking restrictions. General instructions Collect urine samples as  told by your health care provider. You may need to collect a urine sample: 24 hours after you pass the stone. 8-12 weeks after you pass the kidney stone, and every 6-12 months after that. Strain your urine every time you urinate, for as long as directed. Use the strainer that your health care provider recommends. Do not throw out the kidney stone after passing it. Keep the stone so it can be tested by your health care provider. Testing the makeup of your kidney stone may help prevent you from getting kidney stones in the future. Keep all follow-up visits. You may need follow-up X-rays or ultrasounds to make sure that your stone has passed. How is this prevented? To prevent another kidney stone: Drink enough fluid to keep your urine pale yellow. This is the best way to prevent kidney stones. Eat a healthy diet. Follow recommendations from your health care provider about foods to avoid. Recommendations vary depending on the type of kidney stone that you have. You may be instructed to eat a low-protein diet. Maintain a healthy weight. Where to find more information National Kidney Foundation (NKF): www.kidney.org [facility] (UCF): www.urologyhealth.org Contact a health care provider if: You have pain that gets worse or does not get better with medicine. Get help right away if: You have a fever or chills. You develop severe pain. You develop new abdominal pain. You faint. You are unable to urinate. Summary Kidney stones are solid, rock-like deposits that form inside of the kidneys. Kidney stones can cause nausea, vomiting, blood in the urine, abdominal pain, and the urge to urinate often. Treatment for kidney stones depends on the size, location, and makeup of the stones. Kidney stones will often pass out of the body through urination. Kidney stones can be prevented by drinking enough fluids, eating a healthy diet, and maintaining a healthy weight. This information is not intended  to replace advice given to you by your health care provider. Make sure you discuss any questions you have with your health care provider. Document Revised: 10/28/2021 Document Reviewed: 10/28/2021 Elsevier Patient Education  2023 [employer] Inc.  

## 2022-08-08 ENCOUNTER — Other Ambulatory Visit: Payer: Self-pay

## 2022-08-08 ENCOUNTER — Emergency Department (HOSPITAL_BASED_OUTPATIENT_CLINIC_OR_DEPARTMENT_OTHER): Payer: Medicaid Other

## 2022-08-08 ENCOUNTER — Emergency Department (HOSPITAL_BASED_OUTPATIENT_CLINIC_OR_DEPARTMENT_OTHER)
Admission: EM | Admit: 2022-08-08 | Discharge: 2022-08-08 | Disposition: A | Discharge status: Home / Self Care | Payer: Medicaid Other | Attending: Emergency Medicine | Admitting: Emergency Medicine

## 2022-08-08 ENCOUNTER — Encounter (HOSPITAL_BASED_OUTPATIENT_CLINIC_OR_DEPARTMENT_OTHER): Payer: Self-pay

## 2022-08-08 DIAGNOSIS — N200 Calculus of kidney: Secondary | ICD-10-CM | POA: Diagnosis not present

## 2022-08-08 DIAGNOSIS — R1031 Right lower quadrant pain: Secondary | ICD-10-CM | POA: Diagnosis present

## 2022-08-08 HISTORY — DX: Vitamin D deficiency, unspecified: E55.9

## 2022-08-08 HISTORY — DX: Depression, unspecified: F32.A

## 2022-08-08 HISTORY — DX: Calculus of kidney: N20.0

## 2022-08-08 LAB — CBC WITH DIFFERENTIAL/PLATELET
Abs Immature Granulocytes: 0.03 10*3/uL (ref 0.00–0.07)
Basophils Absolute: 0 10*3/uL (ref 0.0–0.1)
Basophils Relative: 0 %
Eosinophils Absolute: 0 10*3/uL (ref 0.0–0.5)
Eosinophils Relative: 0 %
HCT: 40.4 % (ref 39.0–52.0)
Hemoglobin: 13.4 g/dL (ref 13.0–17.0)
Immature Granulocytes: 0 %
Lymphocytes Relative: 8 %
Lymphs Abs: 0.7 10*3/uL (ref 0.7–4.0)
MCH: 27.3 pg (ref 26.0–34.0)
MCHC: 33.2 g/dL (ref 30.0–36.0)
MCV: 82.3 fL (ref 80.0–100.0)
Monocytes Absolute: 0.6 10*3/uL (ref 0.1–1.0)
Monocytes Relative: 7 %
Neutro Abs: 7.3 10*3/uL (ref 1.7–7.7)
Neutrophils Relative %: 85 %
Platelets: 203 10*3/uL (ref 150–400)
RBC: 4.91 MIL/uL (ref 4.22–5.81)
RDW: 13.1 % (ref 11.5–15.5)
WBC: 8.6 10*3/uL (ref 4.0–10.5)
nRBC: 0 % (ref 0.0–0.2)

## 2022-08-08 LAB — BASIC METABOLIC PANEL
Anion gap: 12 (ref 5–15)
BUN: 8 mg/dL (ref 6–20)
CO2: 26 mmol/L (ref 22–32)
Calcium: 10.4 mg/dL — ABNORMAL HIGH (ref 8.9–10.3)
Chloride: 102 mmol/L (ref 98–111)
Creatinine, Ser: 1.26 mg/dL — ABNORMAL HIGH (ref 0.61–1.24)
GFR, Estimated: >60 mL/min (ref >60)
Glucose, Bld: 84 mg/dL (ref 70–99)
Potassium: 4.1 mmol/L (ref 3.5–5.1)
Sodium: 140 mmol/L (ref 135–145)

## 2022-08-08 LAB — URINALYSIS, ROUTINE W REFLEX MICROSCOPIC
Bacteria, UA: NONE SEEN
Bilirubin Urine: NEGATIVE
Glucose, UA: NEGATIVE mg/dL
Ketones, ur: 15 mg/dL — AB
Leukocytes,Ua: NEGATIVE
Nitrite: NEGATIVE
Protein, ur: >300 mg/dL — AB
RBC / HPF: >50 RBC/hpf — ABNORMAL HIGH (ref 0–5)
Specific Gravity, Urine: 1.034 — ABNORMAL HIGH (ref 1.005–1.030)
pH: 6.5 (ref 5.0–8.0)

## 2022-08-08 MED ORDER — IBUPROFEN 400 MG PO TABS: 400.0000 mg | ORAL_TABLET | Freq: Four times a day (QID) | ORAL | 0 refills | Status: AC | PRN

## 2022-08-08 MED ORDER — KETOROLAC TROMETHAMINE 30 MG/ML IJ SOLN
30.0000 mg | Freq: Once | INTRAMUSCULAR | Status: AC
Start: 2022-08-08 — End: 2022-08-08
  Administered 2022-08-08: 30 mg via INTRAVENOUS
  Filled 2022-08-08: qty 1

## 2022-08-08 MED ORDER — HYDROCODONE-ACETAMINOPHEN 5-325 MG PO TABS: 1.0000 | ORAL_TABLET | Freq: Four times a day (QID) | ORAL | 0 refills | Status: AC | PRN

## 2022-08-08 MED ORDER — LACTATED RINGERS IV BOLUS
1000.0000 mL | Freq: Once | INTRAVENOUS | Status: AC
  Administered 2022-08-08: 1000 mL via INTRAVENOUS

## 2022-08-08 MED ORDER — ONDANSETRON HCL 4 MG/2ML IJ SOLN
4.0000 mg | Freq: Once | INTRAMUSCULAR | Status: AC
  Administered 2022-08-08: 4 mg via INTRAVENOUS
  Filled 2022-08-08: qty 2

## 2022-08-08 MED ORDER — ONDANSETRON HCL 4 MG PO TABS: 4.0000 mg | ORAL_TABLET | Freq: Four times a day (QID) | ORAL | 0 refills | Status: AC

## 2022-08-08 NOTE — ED Triage Notes (Signed)
Pt arrives POV from home with c/o one week history of right flank pain.  History of left kidney stone in December.  Has been taking Flomax for past week.  Pain worse today, episode of nausea/vomiting today.

## 2022-08-08 NOTE — Discharge Instructions (Signed)
1.  Take ibuprofen every 6-8 hours for your baseline pain control.  You may add 1-2 Vicodin tablets every 6 hours as needed for additional pain control.  Use Zofran to control nausea.  Continue your Flomax.  Drink plenty of fluids and stay hydrated. 2.  Strain your urine to look for a kidney stone. 3.  Schedule follow-up appoint with [facility]. 4.  Return to emergency department if you develop a fever, pain not controlled by your medications, weakness, signs of dehydration or other concerning changes.

## 2022-08-08 NOTE — ED Provider Notes (Signed)
[facility] EMERGENCY DEPT Provider Note   CSN: [id number] Arrival date & time: 08/08/22  1320     History  Chief Complaint  Patient presents with   Flank Pain    Charles Kemp is a 27 y.o. male.  HPI Patient had a kidney stone left in December.  He has been taking Flomax.  Pain on the left improved but then he developed pain on the right about a week ago.  He has been taking the Flomax that he had been prescribed for the left kidney stone.  Today however the pain got worse and he started getting some vomiting.  No fevers, no chills, no burning with urination.  December was the first time patient has ever had a kidney stone.  He does not have other significant medical history.    Home Medications Prior to Admission medications   Medication Sig Start Date End Date Taking? Authorizing Provider  HYDROcodone-acetaminophen (NORCO/VICODIN) 5-325 MG tablet Take 1-2 tablets by mouth every 6 (six) hours as needed. 08/08/22  Yes Arby Barrette, MD  ibuprofen (ADVIL) 400 MG tablet Take 1 tablet (400 mg total) by mouth every 6 (six) hours as needed. 08/08/22  Yes Lakera Viall, Lebron Conners, MD  ondansetron (ZOFRAN) 4 MG tablet Take 1 tablet (4 mg total) by mouth every 6 (six) hours. 08/08/22  Yes Arby Barrette, MD  Albuterol Sulfate (PROAIR RESPICLICK) 108 (90 Base) MCG/ACT AEPB Inhale 2 puffs into the lungs 2 (two) times daily as needed for shortness of breath.    [provider]  aspirin-acetaminophen-caffeine (EXCEDRIN MIGRAINE) [phone] MG per tablet Take by mouth every 6 (six) hours as needed for headache.    [provider]  DiphenhydrAMINE HCl (BENADRYL ALLERGY PO) Take 1 tablet by mouth daily as needed (seasonal allergies).     [provider]  ipratropium (ATROVENT) 0.06 % nasal spray Place 2 sprays into both nostrils 4 (four) times daily. 04/05/14   Rodolph Bong, MD  ondansetron (ZOFRAN ODT) 4 MG disintegrating tablet Take 1 tablet (4 mg total) by mouth every  8 (eight) hours as needed for nausea or vomiting. 01/31/18   Cathie Hoops, Amy V, PA-C  promethazine (PHENERGAN) 25 MG tablet Take 1 tablet (25 mg total) by mouth every 6 (six) hours as needed for nausea or vomiting. 02/05/18   Lawyer, Cristal Deer, PA-C  tamsulosin (FLOMAX) 0.4 MG CAPS capsule Take 1 capsule (0.4 mg total) by mouth daily. 08/01/22   Junie Spencer, FNP      Allergies    Patient has no known allergies.    Review of Systems   Review of Systems  Physical Exam Updated Vital Signs BP 128/62 (BP Location: Right Arm)   Pulse 68   Temp 97.9 F (36.6 C) (Oral)   Resp 18   Ht 6\' 1"  (1.854 m)   Wt 64.9 kg   SpO2 99%   BMI 18.87 kg/m  Physical Exam Constitutional:      Comments: Alert nontoxic.  Well in appearance.  Well-nourished well-developed.  HENT:     Mouth/Throat:     Pharynx: Oropharynx is clear.  Eyes:     Extraocular Movements: Extraocular movements intact.  Cardiovascular:     Rate and Rhythm: Normal rate and regular rhythm.  Pulmonary:     Effort: Pulmonary effort is normal.     Breath sounds: Normal breath sounds.  Abdominal:     General: There is no distension.     Palpations: Abdomen is soft.     Tenderness:  There is no abdominal tenderness. There is no guarding.     Comments: No CVA tenderness to percussion.  Musculoskeletal:        General: No swelling. Normal range of motion.     Right lower leg: No edema.     Left lower leg: No edema.  Skin:    General: Skin is warm and dry.  Neurological:     General: No focal deficit present.     Mental Status: He is oriented to person, place, and time.     Coordination: Coordination normal.  Psychiatric:        Mood and Affect: Mood normal.     ED Results / Procedures / Treatments   Labs (all labs ordered are listed, but only abnormal results are displayed) Labs Reviewed  URINALYSIS, ROUTINE W REFLEX MICROSCOPIC - Abnormal; Notable for the following components:      Result Value   Specific Gravity, Urine  1.034 (*)    Hgb urine dipstick LARGE (*)    Ketones, ur 15 (*)    Protein, ur >300 (*)    RBC / HPF >50 (*)    All other components within normal limits  BASIC METABOLIC PANEL - Abnormal; Notable for the following components:   Creatinine, Ser 1.26 (*)    Calcium 10.4 (*)    All other components within normal limits  CBC WITH DIFFERENTIAL/PLATELET    EKG None  Radiology CT Renal Stone Study  Result Date: 08/08/2022 CLINICAL DATA:  Abdominal/flank pain, kidney stone suspected. Right flank pain for 1 week. History of left-sided kidney stone. EXAM: CT ABDOMEN AND PELVIS WITHOUT CONTRAST TECHNIQUE: Multidetector CT imaging of the abdomen and pelvis was performed following the standard protocol without IV contrast. RADIATION DOSE REDUCTION: This exam was performed according to the departmental dose-optimization program which includes automated exposure control, adjustment of the mA and/or kV according to patient size and/or use of iterative reconstruction technique. COMPARISON:  None Available. FINDINGS: Lower chest: Clear lung bases. No significant pleural or pericardial effusion. Hepatobiliary: No suspicious hepatic abnormalities are identified on noncontrast imaging. There is a probable 7 mm cyst posteriorly in the dome of the right hepatic lobe. No evidence of gallstones, gallbladder wall thickening or biliary dilatation. Pancreas: Unremarkable. No pancreatic ductal dilatation or surrounding inflammatory changes. Spleen: Normal in size without focal abnormality. Adrenals/Urinary Tract: Both adrenal glands appear normal. There are punctate renal calculi bilaterally, approximately 5 in each kidney. There is asymmetric perinephric soft tissue stranding on the right associated with right-sided hydronephrosis and hydroureter due to an obstructing 3 mm calculus at the right ureterovesical junction (image 70/2). This calculus is not well visualized on the scout image due to overlap with the sacrum. No  evidence of left-sided hydronephrosis. The bladder is nearly empty and suboptimally evaluated, although shows no other significant findings. Stomach/Bowel: No enteric contrast administered. The stomach appears unremarkable for its degree of distension. No evidence of bowel wall thickening, distention or surrounding inflammatory change. Vascular/Lymphatic: There are no enlarged abdominal or pelvic lymph nodes. No significant vascular findings on noncontrast imaging. Reproductive: The prostate gland and seminal vesicles appear unremarkable. Other: No evidence of abdominal wall mass or hernia. No ascites. Musculoskeletal: No acute or significant osseous findings. IMPRESSION: 1. Obstructing 3 mm calculus at the right ureterovesical junction with associated right-sided hydronephrosis and perinephric soft tissue stranding. 2. Additional punctate renal calculi bilaterally. 3. No other significant findings. Electronically Signed   By: Richardean Sale M.D.   On: 08/08/2022 16:10  Procedures Procedures    Medications Ordered in ED Medications  lactated ringers bolus 1,000 mL (1,000 mLs Intravenous New Bag/Given 08/08/22 1711)  ondansetron (ZOFRAN) injection 4 mg (4 mg Intravenous Given 08/08/22 1708)  ketorolac (TORADOL) 30 MG/ML injection 30 mg (30 mg Intravenous Given 08/08/22 1710)    ED Course/ Medical Decision Making/ A&P                           Medical Decision Making Amount and/or Complexity of Data Reviewed Labs: ordered. Radiology: ordered.  Risk Prescription drug management.   Patient has recently developed kidney stones.  He presents with right-sided pain where previously had had left.  Pain quality is very similar.  He does not have fevers.  He is nontoxic.  Differential diagnosis includes recurrent kidney stone\pyelonephritis\appendicitis.  CT stone study obtained and reviewed by radiology.  Positive for 3 mm right UVJ stone with perinephric stranding.  Urinalysis negative for white  cells.  Greater than 50 red cells.  No signs of infection on UA.  Patient does not have fever or other constitutional symptoms.  Currently we will not be prescribing antibiotics based on clinical appearance and no signs of infection.  Patient was rehydrated with lactated Ringer's and given Zofran for nausea.  He was treated with a dose of Toradol.  On recheck he feels much improved.  At this time stable for discharge.  We reviewed a follow-up plan with [facility].  We reviewed return precautions and home management of pain.        Final Clinical Impression(s) / ED Diagnoses Final diagnoses:  Kidney stone    Rx / DC Orders ED Discharge Orders          Ordered    ondansetron (ZOFRAN) 4 MG tablet  Every 6 hours        08/08/22 1848    HYDROcodone-acetaminophen (NORCO/VICODIN) 5-325 MG tablet  Every 6 hours PRN        08/08/22 1848    ibuprofen (ADVIL) 400 MG tablet  Every 6 hours PRN        08/08/22 Atlee Abide, MD 08/08/22 1855
